# Patient Record
Sex: Male | Born: 1965 | Race: Black or African American | Hispanic: No | Smoking: Current every day smoker
Health system: Southern US, Community
[De-identification: ages and names within clinical notes are randomized; demographics above are authoritative.]

## PROBLEM LIST (undated history)

## (undated) HISTORY — PX: ELBOW SURGERY: SHX618

---

## 1998-07-01 ENCOUNTER — Emergency Department (HOSPITAL_COMMUNITY): Admission: EM | Admit: 1998-07-01 | Discharge: 1998-07-01 | Payer: Self-pay | Admitting: Endocrinology

## 1999-09-12 ENCOUNTER — Encounter: Payer: Self-pay | Admitting: Emergency Medicine

## 1999-09-12 ENCOUNTER — Inpatient Hospital Stay (HOSPITAL_COMMUNITY): Admission: EM | Admit: 1999-09-12 | Discharge: 1999-09-14 | Payer: Self-pay | Admitting: Emergency Medicine

## 1999-09-13 ENCOUNTER — Encounter: Payer: Self-pay | Admitting: Orthopaedic Surgery

## 2008-03-06 ENCOUNTER — Ambulatory Visit (HOSPITAL_BASED_OUTPATIENT_CLINIC_OR_DEPARTMENT_OTHER): Admission: RE | Admit: 2008-03-06 | Discharge: 2008-03-06 | Payer: Self-pay | Admitting: Urology

## 2009-07-06 ENCOUNTER — Emergency Department (HOSPITAL_COMMUNITY): Admission: EM | Admit: 2009-07-06 | Discharge: 2009-07-06 | Payer: Self-pay | Admitting: Emergency Medicine

## 2011-03-16 ENCOUNTER — Inpatient Hospital Stay (INDEPENDENT_AMBULATORY_CARE_PROVIDER_SITE_OTHER)
Admission: RE | Admit: 2011-03-16 | Discharge: 2011-03-16 | Disposition: A | Discharge status: Home / Self Care | Payer: 59 | Source: Ambulatory Visit | Attending: Family Medicine | Admitting: Family Medicine

## 2011-03-16 DIAGNOSIS — R51 Headache: Secondary | ICD-10-CM

## 2011-03-19 ENCOUNTER — Other Ambulatory Visit: Payer: Self-pay | Admitting: Internal Medicine

## 2011-03-19 DIAGNOSIS — R51 Headache: Secondary | ICD-10-CM

## 2011-03-21 ENCOUNTER — Other Ambulatory Visit: Payer: 59

## 2011-03-26 ENCOUNTER — Ambulatory Visit
Admission: RE | Admit: 2011-03-26 | Discharge: 2011-03-26 | Disposition: A | Discharge status: Home / Self Care | Payer: 59 | Source: Ambulatory Visit | Attending: Internal Medicine | Admitting: Internal Medicine

## 2011-03-26 DIAGNOSIS — R51 Headache: Secondary | ICD-10-CM

## 2011-04-29 NOTE — Consult Note (Signed)
NAMEMUSA, REWERTS NO.:  [id number]   MEDICAL RECORD NO.:  [id number]          PATIENT TYPE:  EMS   LOCATION:  [facility]                         FACILITY:  [facility]   PHYSICIAN:  Loreta Ave, MD DATE OF BIRTH:  1966-09-26   DATE OF CONSULTATION:  07/06/2009  DATE OF DISCHARGE:  07/06/2009                                 CONSULTATION   REFERRING Docia Klar:  Emergency room.   CHIEF COMPLAINT:  Left thumb crush injury.   HISTORY OF PRESENT ILLNESS:  Douglas Holloway is a 45 year old right hand  dominant male who was working early this morning when a piece of heavy  equipment (several tons) fell from an I-beam and crushed his left thumb.  He sustained no injuries to his other fingers.  He developed laceration  along the radial and ulnar borders of the thumb with associated nail bed  injury.  He has intact sensation to light touch along the volar aspect  of his thumb.   MEDICATION ALLERGIES:  WELLBUTRIN causes a rash.   MEDICATIONS:  None.   PAST MEDICAL HISTORY:  None.   PAST SURGICAL HISTORY:  None.   SOCIAL HISTORY:  Smokes 1 pack per day.  Drinks occasionally.  Denies IV  drug use.   FAMILY HISTORY:  Noncontributory.   REVIEW OF SYSTEMS:  Noncontributory.   PHYSICAL EXAMINATION:  EXTREMITIES:  Examination of the left hand  reveals normoactive and passive range of motion of the fingers, hand,  and wrist.  Flexion and extension are intact to the distal phalanx of  the left thumb.  He has a rupture of the skin at the glabrous and  nonglabrous junction along the ulnar aspect of the distal and proximal  phalanges.  He also has ruptured the skin over the pulp of the thumb pad  involving a laceration that is 2 cm long.  There is also laceration of  the nail bed with some loss of the nail bed on the radial portion.   X-ray examination revealed a comminuted tuft fracture.  However, the  main body of the distal phalanx is intact with smaller fragments  distally.   There is no fracture of the proximal phalanx.   ASSESSMENT AND PLAN:  Douglas Holloway is a 45 year old right hand-dominant  male with a left exploded thumb.   I discussed the severe nature of this injury with Douglas Holloway and told him  that I could not guarantee that the volar skin flap or his thumb would  survive in their entirety.  I offered repair of the injured structures  including the skin and nail bed and open distal phalanx fracture.  He  understands the risks of repair to be infection, bleeding, stiffness,  scarring, eventual loss of his thumb, potentially the need for  amputation, and the need for additional reconstructive surgeries.  He  desires to proceed.   After verbal consent, a digital block of 2% lidocaine plain was injected  at about the base of the left thumb.  A digital tourniquet was applied  at the base.  The wound was irrigated copiously with normal saline, and  bony fragments were removed from the wound.  There were tattered edges  along the radial side of the nailbed and these were debrided.  His nail  was removed.  Next, his skin lacerations were repaired with interrupted  4-0 chromic sutures.  His nail bed was repaired with a 6-0 running  chromic suture.  His nail was trimmed and placed back over the nailbed  to stent the petechial fold.  This was sutured in place with 4-0 chromic  sutures.  Xeroform, dry sterile dressings, and a thumb spica were  applied.  The digital tourniquet was previously removed and good  capillary refill was noted to all aspects of the thumb tip.   Douglas Holloway was given a prescription for Dilaudid 2 mg 1-2 p.o. q.2-3 h  p.r.n. for pain, dispensed 80 and Augmentin 875 mg p.o. b.i.d. for 5  days.  His tetanus is up-to-date.  I will see him for a wound check n 6  days and will continue with his splint.  He is not to work while he is  taking pain medicine.  Once he is no longer taking pain medicine, he may  return to work with no use of his  left hand.      Loreta Ave, MD  Electronically Signed     CF/MEDQ  D:  07/06/2009  T:  07/07/2009  Job:  [id number]

## 2011-04-29 NOTE — Op Note (Signed)
Douglas Holloway, Douglas Holloway                ACCOUNT NO.:  [id number]   MEDICAL RECORD NO.:  [id number]          PATIENT TYPE:  AMB   LOCATION:  [facility]                         FACILITY:  [facility]   PHYSICIAN:  Valetta Fuller, M.D.  DATE OF BIRTH:  Nov 26, 1966   DATE OF PROCEDURE:  03/06/2008  DATE OF DISCHARGE:                               OPERATIVE REPORT   PREOPERATIVE DIAGNOSIS:  Penile phimosis with recurrent balanitis.   POSTOPERATIVE DIAGNOSIS:  Penile phimosis with recurrent balanitis.   PROCEDURE PERFORMED:  Circumcision.   INDICATIONS:  [name] is a 45 year old male.  He has come in on several  occasions in our office complaining of some mild phimosis with recurrent  balanitis and penile irritation with intercourse.  We have talked to him  in the past about the possibility of circumcision.  He wished to proceed  with that.  He appeared to understand the advantages, disadvantages,  potential complications of circumcision and full informed consent was  obtained.  He presents now for the procedure.   TECHNIQUE AND FINDINGS:  The patient was brought to the operating room  where he had successful induction of general anesthesia.  He was placed  in the supine position, prepped and draped in the usual manner.  A  penile block with plain Marcaine was utilized to reduce anesthetic  requirements and to help with postop analgesia.  A circumferential  incision was made behind the coronal sulcus.  The foreskin was then  retracted.  There was no evidence of active infection or balanitis at  this time.  A separate circumferential incision was made the mucosal  collar and the sleeve of redundant skin was removed.  Skin edges were  then reapproximated with 4-0 Vicryl suture in an interrupted manner.  A  loosely applied dressing was then placed around the incision including  some petroleum gauze with bacitracin gauze and loosely applied Coban.  The patient appeared to tolerate the procedure well.   There were no  obvious complications or difficulties.  He was brought to the recovery  room in stable condition.           ______________________________  Valetta Fuller, M.D.  Electronically Signed     DSG/MEDQ  D:  03/06/2008  T:  03/06/2008  Job:  [id number]

## 2011-09-08 LAB — POCT HEMOGLOBIN-HEMACUE: Hemoglobin: 12.9 — ABNORMAL LOW

## 2014-12-03 ENCOUNTER — Encounter (HOSPITAL_COMMUNITY): Payer: Self-pay

## 2014-12-03 ENCOUNTER — Emergency Department (HOSPITAL_COMMUNITY)
Admission: EM | Admit: 2014-12-03 | Discharge: 2014-12-03 | Disposition: A | Discharge status: Home / Self Care | Payer: BC Managed Care – PPO | Attending: Emergency Medicine | Admitting: Emergency Medicine

## 2014-12-03 DIAGNOSIS — Y998 Other external cause status: Secondary | ICD-10-CM | POA: Insufficient documentation

## 2014-12-03 DIAGNOSIS — Y9389 Activity, other specified: Secondary | ICD-10-CM | POA: Diagnosis not present

## 2014-12-03 DIAGNOSIS — S0993XA Unspecified injury of face, initial encounter: Secondary | ICD-10-CM | POA: Diagnosis present

## 2014-12-03 DIAGNOSIS — S01511A Laceration without foreign body of lip, initial encounter: Secondary | ICD-10-CM | POA: Diagnosis not present

## 2014-12-03 DIAGNOSIS — Z720 Tobacco use: Secondary | ICD-10-CM | POA: Insufficient documentation

## 2014-12-03 DIAGNOSIS — Y92410 Unspecified street and highway as the place of occurrence of the external cause: Secondary | ICD-10-CM | POA: Diagnosis not present

## 2014-12-03 MED ORDER — IBUPROFEN 800 MG PO TABS
800.0000 mg | ORAL_TABLET | Freq: Once | ORAL | Status: AC
  Administered 2014-12-03: 800 mg via ORAL
  Filled 2014-12-03: qty 1

## 2014-12-03 MED ORDER — IBUPROFEN 600 MG PO TABS: 600.0000 mg | ORAL_TABLET | Freq: Four times a day (QID) | ORAL | Status: DC | PRN

## 2014-12-03 MED ORDER — CYCLOBENZAPRINE HCL 10 MG PO TABS: 10.0000 mg | ORAL_TABLET | Freq: Two times a day (BID) | ORAL | Status: AC | PRN
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
## 2014-12-03 NOTE — ED Notes (Signed)
Pt rinsed mouth with 1/2 strength peroxide.

## 2014-12-07 ENCOUNTER — Encounter (HOSPITAL_COMMUNITY): Payer: Self-pay | Admitting: Emergency Medicine

## 2014-12-07 ENCOUNTER — Emergency Department (HOSPITAL_COMMUNITY): Payer: BC Managed Care – PPO

## 2014-12-07 ENCOUNTER — Emergency Department (HOSPITAL_COMMUNITY)
Admission: EM | Admit: 2014-12-07 | Discharge: 2014-12-08 | Disposition: A | Discharge status: Home / Self Care | Payer: BC Managed Care – PPO | Attending: Emergency Medicine | Admitting: Emergency Medicine

## 2014-12-07 DIAGNOSIS — S199XXD Unspecified injury of neck, subsequent encounter: Secondary | ICD-10-CM | POA: Insufficient documentation

## 2014-12-07 DIAGNOSIS — S0993XD Unspecified injury of face, subsequent encounter: Secondary | ICD-10-CM | POA: Diagnosis present

## 2014-12-07 DIAGNOSIS — R51 Headache: Secondary | ICD-10-CM | POA: Insufficient documentation

## 2014-12-07 DIAGNOSIS — R220 Localized swelling, mass and lump, head: Secondary | ICD-10-CM

## 2014-12-07 DIAGNOSIS — Z720 Tobacco use: Secondary | ICD-10-CM | POA: Diagnosis not present

## 2014-12-07 DIAGNOSIS — S0083XD Contusion of other part of head, subsequent encounter: Secondary | ICD-10-CM | POA: Insufficient documentation

## 2014-12-07 DIAGNOSIS — G4452 New daily persistent headache (NDPH): Secondary | ICD-10-CM

## 2014-12-07 MED ORDER — HYDROCODONE-ACETAMINOPHEN 5-325 MG PO TABS
2.0000 | ORAL_TABLET | Freq: Once | ORAL | Status: AC
  Administered 2014-12-07: 2 via ORAL
  Filled 2014-12-07: qty 2

## 2014-12-07 MED ORDER — HYDROCODONE-ACETAMINOPHEN 5-325 MG PO TABS: 1.0000 | ORAL_TABLET | Freq: Four times a day (QID) | ORAL | Status: DC | PRN
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
## 2014-12-07 NOTE — Discharge Instructions (Signed)
1. Medications: vicodin, usual home medications 2. Treatment: rest, drink plenty of fluids, ibuprofen for continued headaches 3. Follow Up: Please followup with your primary doctor in 4 days for discussion of your diagnoses and further evaluation after today's visit; if you do not have a primary care doctor use the resource guide provided to find one; Please return to the ER for seizures, worsening confusion, other concerning symptoms   Post-Concussion Syndrome Post-concussion syndrome describes the symptoms that can occur after a head injury. These symptoms can last from weeks to months. CAUSES  It is not clear why some head injuries cause post-concussion syndrome. It can occur whether your head injury was mild or severe and whether you were wearing head protection or not.  SIGNS AND SYMPTOMS  Memory difficulties.  Dizziness.  Headaches.  Double vision or blurry vision.  Sensitivity to light.  Hearing difficulties.  Depression.  Tiredness.  Weakness.  Difficulty with concentration.  Difficulty sleeping or staying asleep.  Vomiting.  Poor balance or instability on your feet.  Slow reaction time.  Difficulty learning and remembering things you have heard. DIAGNOSIS  There is no test to determine whether you have post-concussion syndrome. Your health care provider may order an imaging scan of your brain, such as a CT scan, to check for other problems that may be causing your symptoms (such as severe injury inside your skull). TREATMENT  Usually, these problems disappear over time without medical care. Your health care provider may prescribe medicine to help ease your symptoms. It is important to follow up with a neurologist to evaluate your recovery and address any lingering symptoms or issues. HOME CARE INSTRUCTIONS   Only take over-the-counter or prescription medicines for pain, discomfort, or fever as directed by your health care provider. Do not take aspirin. Aspirin  can slow blood clotting.  Sleep with your head slightly elevated to help with headaches.  Avoid any situation where there is potential for another head injury (football, hockey, soccer, basketball, martial arts, downhill snow sports, and horseback riding). Your condition will get worse every time you experience a concussion. You should avoid these activities until you are evaluated by the appropriate follow-up health care providers.  Keep all follow-up appointments as directed by your health care provider. SEEK IMMEDIATE MEDICAL CARE IF:  You develop confusion or unusual drowsiness.  You cannot wake the injured person.  You develop nausea or persistent, forceful vomiting.  You feel like you are moving when you are not (vertigo).  You notice the injured person's eyes moving rapidly back and forth. This may be a sign of vertigo.  You have convulsions or faint.  You have severe, persistent headaches that are not relieved by medicine.  You cannot use your arms or legs normally.  Your pupils change size.  You have clear or bloody discharge from the nose or ears.  Your problems are getting worse, not better. MAKE SURE YOU:  Understand these instructions.  Will watch your condition.  Will get help right away if you are not doing well or get worse. Document Released: 05/23/2002 Document Revised: 09/21/2013 Document Reviewed: 03/08/2014 [facility] Patient Information 2015 [place], [place]. This information is not intended to replace advice given to you by your health care provider. Make sure you discuss any questions you have with your health care provider.
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
## 2015-01-31 ENCOUNTER — Ambulatory Visit
Admission: RE | Admit: 2015-01-31 | Discharge: 2015-01-31 | Disposition: A | Discharge status: Home / Self Care | Payer: BLUE CROSS/BLUE SHIELD | Source: Ambulatory Visit | Attending: Internal Medicine | Admitting: Internal Medicine

## 2015-01-31 ENCOUNTER — Other Ambulatory Visit: Payer: Self-pay | Admitting: Internal Medicine

## 2015-01-31 DIAGNOSIS — F17201 Nicotine dependence, unspecified, in remission: Secondary | ICD-10-CM

## 2015-07-19 DIAGNOSIS — Y990 Civilian activity done for income or pay: Secondary | ICD-10-CM | POA: Insufficient documentation

## 2015-07-19 DIAGNOSIS — W1839XA Other fall on same level, initial encounter: Secondary | ICD-10-CM | POA: Insufficient documentation

## 2015-07-19 DIAGNOSIS — Y9289 Other specified places as the place of occurrence of the external cause: Secondary | ICD-10-CM | POA: Diagnosis not present

## 2015-07-19 DIAGNOSIS — S8991XA Unspecified injury of right lower leg, initial encounter: Secondary | ICD-10-CM | POA: Insufficient documentation

## 2015-07-19 DIAGNOSIS — Y9389 Activity, other specified: Secondary | ICD-10-CM | POA: Insufficient documentation

## 2015-07-19 DIAGNOSIS — Z720 Tobacco use: Secondary | ICD-10-CM | POA: Insufficient documentation

## 2015-07-20 ENCOUNTER — Encounter (HOSPITAL_COMMUNITY): Payer: Self-pay | Admitting: Emergency Medicine

## 2015-07-20 ENCOUNTER — Emergency Department (HOSPITAL_COMMUNITY): Payer: Worker's Compensation

## 2015-07-20 ENCOUNTER — Emergency Department (HOSPITAL_COMMUNITY)
Admission: EM | Admit: 2015-07-20 | Discharge: 2015-07-20 | Disposition: A | Discharge status: Home / Self Care | Payer: Worker's Compensation | Attending: Emergency Medicine | Admitting: Emergency Medicine

## 2015-07-20 DIAGNOSIS — M25561 Pain in right knee: Secondary | ICD-10-CM

## 2015-07-20 MED ORDER — IBUPROFEN 800 MG PO TABS: 800.0000 mg | ORAL_TABLET | Freq: Three times a day (TID) | ORAL | Status: AC

## 2015-07-20 MED ORDER — HYDROCODONE-ACETAMINOPHEN 5-325 MG PO TABS: 1.0000 | ORAL_TABLET | Freq: Four times a day (QID) | ORAL | Status: AC | PRN

## 2015-07-20 NOTE — ED Notes (Signed)
Pt. fell at work this evening , no LOC / ambulatory , reports pain at right knee worse with movement .

## 2015-07-20 NOTE — ED Notes (Signed)
Pt states unable to void at this time. 

## 2015-07-20 NOTE — ED Notes (Signed)
Pt urinated for work comp drug screen without witness present, so specimen must be recollected.

## 2015-07-20 NOTE — ED Provider Notes (Signed)
CSN: [id number]     Arrival date & time 07/19/15  2357 History   First MD Initiated Contact with Patient 07/20/15 0023     Chief Complaint  Patient presents with  . Knee Pain     (Consider location/radiation/quality/duration/timing/severity/associated sxs/prior Treatment) HPI   Patient is a 49 year old male reports the ER after sustaining a work injury where he was carrying heavy objects and had a planting of his right foot with a twisting motion and fell, with acute onset right knee pain and swelling.  He is able to initially bear some weight, but he has had increasing swelling and pain which is made him limp.  The pain is located on the medial side of his knee with some mild swelling but no erythema and he does not have any abrasions or deformities.    History reviewed. No pertinent past medical history. Past Surgical History  Procedure Laterality Date  . Elbow surgery     No family history on file. History  Substance Use Topics  . Smoking status: Current Every Day Smoker  . Smokeless tobacco: Not on file  . Alcohol Use: Yes     Comment: occ     Review of Systems  All other systems reviewed and are negative.  Allergies  Wellbutrin  Home Medications   Prior to Admission medications   Medication Sig Start Date End Date Taking? Authorizing Provider  cyclobenzaprine (FLEXERIL) 10 MG tablet Take 1 tablet (10 mg total) by mouth 2 (two) times daily as needed for muscle spasms. 12/03/14   Arby Barrette, MD  HYDROcodone-acetaminophen (NORCO/VICODIN) 5-325 MG per tablet Take 1-2 tablets by mouth every 6 (six) hours as needed for severe pain. 07/20/15   Danelle Berry, PA-C  ibuprofen (ADVIL,MOTRIN) 800 MG tablet Take 1 tablet (800 mg total) by mouth 3 (three) times daily. 07/20/15   Danelle Berry, PA-C   BP 95/75 mmHg  Pulse 76  Temp(Src) 98.2 F (36.8 C) (Oral)  Resp 22  Ht  (1.651 m)  Wt 112 lb (50.803 kg)  BMI 18.64 kg/m2  SpO2 100% Physical Exam  Constitutional: He is  oriented to person, place, and time. He appears well-developed and well-nourished. No distress.  HENT:  Head: Normocephalic and atraumatic.  Right Ear: External ear normal.  Left Ear: External ear normal.  Nose: Nose normal.  Eyes: Conjunctivae and EOM are normal. Pupils are equal, round, and reactive to light. Right eye exhibits no discharge. Left eye exhibits no discharge. No scleral icterus.  Neck: Normal range of motion. Neck supple. No JVD present. No tracheal deviation present.  Cardiovascular: Normal rate and regular rhythm.   Pulmonary/Chest: Effort normal and breath sounds normal. No stridor. No respiratory distress.  Musculoskeletal: He exhibits edema and tenderness.       Right knee: He exhibits swelling, effusion, bony tenderness and abnormal meniscus. He exhibits no ecchymosis, no laceration, no erythema, normal alignment, no LCL laxity and normal patellar mobility. Tenderness found. Medial joint line and MCL tenderness noted. No lateral joint line, no LCL and no patellar tendon tenderness noted.  Right knee tenderness to MCL, with palpable ballotting No tenderness to lateral knee, popliteal fossa, patella, supra-or infrapatellar tendon.  Negative anterior drawer test.  Positive McMurray's, pain with varus and valgus stress.   Knee flexion to 90, extension limited by pain  Lymphadenopathy:    He has no cervical adenopathy.  Neurological: He is alert and oriented to person, place, and time. He is not disoriented. No sensory deficit. He  exhibits normal muscle tone. Coordination normal.  Antalgic gait  Skin: Skin is warm and dry. No rash noted. He is not diaphoretic. No erythema. No pallor.  Psychiatric: He has a normal mood and affect. His behavior is normal. Judgment and thought content normal.  Nursing note and vitals reviewed.   ED Course  Procedures (including critical care time) Labs Review Labs Reviewed - No data to display  Imaging Review Dg Knee Complete 4 Views  Right  07/20/2015   CLINICAL DATA:  Status post fall into hole, with right medial knee pain. Initial encounter.  EXAM: RIGHT KNEE - COMPLETE 4+ VIEW  COMPARISON:  None.  FINDINGS: There is no evidence of fracture or dislocation. The joint spaces are preserved. No significant degenerative change is seen; the patellofemoral joint is grossly unremarkable in appearance. A small enthesophyte is seen at the upper pole of the patella.  A small knee joint effusion is suggested. The visualized soft tissues are otherwise unremarkable in appearance.  IMPRESSION: 1. No evidence of fracture or dislocation. 2. Small knee joint effusion suggested.   Electronically Signed   By: Roanna Raider M.D.   On: 07/20/2015 01:19    EKG Interpretation None      MDM   Final diagnoses:  Knee pain, acute, right    Patient with right knee pain - tenderness the medial aspect of the right knee with effusion Patient is able to walk but states it is painful, negative anterior drawer, knee does not appear unstable. Clinically I suspect a medial meniscus and MCL injury. X-rays obtained which reveal no fracture and small effusion - pt was place in knee immobilizer and fitted with crutches, with education from ED tech. I reviewed the findings with the patient, I have discussed tx with the patient - and need for rest, ice, compression and elevation as well as NSAIDs with brief pain pill prescription and follow-up with orthopedic surgeon.  PT vitals were reviewed, he was discharged home in satisfactory condition.       Danelle Berry, PA-C 07/20/15 0144  Eber Hong, MD 07/20/15 [phone]

## 2015-07-20 NOTE — Discharge Instructions (Signed)

## 2015-07-31 ENCOUNTER — Other Ambulatory Visit: Payer: Self-pay | Admitting: Orthopaedic Surgery

## 2015-07-31 DIAGNOSIS — M25561 Pain in right knee: Secondary | ICD-10-CM

## 2015-08-07 ENCOUNTER — Ambulatory Visit
Admission: RE | Admit: 2015-08-07 | Discharge: 2015-08-07 | Disposition: A | Discharge status: Home / Self Care | Payer: Worker's Compensation | Source: Ambulatory Visit | Attending: Orthopaedic Surgery | Admitting: Orthopaedic Surgery

## 2015-08-07 DIAGNOSIS — M25561 Pain in right knee: Secondary | ICD-10-CM

## 2015-09-02 ENCOUNTER — Emergency Department (HOSPITAL_COMMUNITY)
Admission: EM | Admit: 2015-09-02 | Discharge: 2015-09-02 | Disposition: A | Discharge status: Home / Self Care | Payer: BLUE CROSS/BLUE SHIELD | Attending: Emergency Medicine | Admitting: Emergency Medicine

## 2015-09-02 ENCOUNTER — Encounter (HOSPITAL_COMMUNITY): Payer: Self-pay | Admitting: *Deleted

## 2015-09-02 DIAGNOSIS — R51 Headache: Secondary | ICD-10-CM | POA: Insufficient documentation

## 2015-09-02 DIAGNOSIS — R519 Headache, unspecified: Secondary | ICD-10-CM

## 2015-09-02 DIAGNOSIS — Z720 Tobacco use: Secondary | ICD-10-CM | POA: Diagnosis not present

## 2015-09-02 MED ORDER — METOCLOPRAMIDE HCL 10 MG PO TABS: 10.0000 mg | ORAL_TABLET | Freq: Four times a day (QID) | ORAL | Status: AC | PRN

## 2015-09-02 MED ORDER — METOCLOPRAMIDE HCL 5 MG/ML IJ SOLN: 10.0000 mg | Freq: Once | INTRAMUSCULAR | Status: DC

## 2015-09-02 MED ORDER — KETOROLAC TROMETHAMINE 15 MG/ML IJ SOLN
15.0000 mg | Freq: Once | INTRAMUSCULAR | Status: AC
  Administered 2015-09-02: 15 mg via INTRAVENOUS
  Filled 2015-09-02: qty 1

## 2015-09-02 MED ORDER — SODIUM CHLORIDE 0.9 % IV BOLUS (SEPSIS)
500.0000 mL | Freq: Once | INTRAVENOUS | Status: AC
  Administered 2015-09-02: 500 mL via INTRAVENOUS

## 2015-09-02 MED ORDER — PROPARACAINE HCL 0.5 % OP SOLN
1.0000 [drp] | Freq: Once | OPHTHALMIC | Status: AC
  Administered 2015-09-02: 1 [drp] via OPHTHALMIC
  Filled 2015-09-02: qty 15

## 2015-09-02 MED ORDER — DIPHENHYDRAMINE HCL 50 MG/ML IJ SOLN
25.0000 mg | Freq: Once | INTRAMUSCULAR | Status: AC
  Administered 2015-09-02: 25 mg via INTRAVENOUS
  Filled 2015-09-02: qty 1

## 2015-09-02 MED ORDER — DIPHENHYDRAMINE HCL 25 MG PO CAPS: 25.0000 mg | ORAL_CAPSULE | Freq: Once | ORAL | Status: DC

## 2015-09-02 MED ORDER — KETOROLAC TROMETHAMINE 60 MG/2ML IM SOLN: 60.0000 mg | Freq: Once | INTRAMUSCULAR | Status: DC

## 2015-09-02 MED ORDER — METOCLOPRAMIDE HCL 5 MG/ML IJ SOLN
10.0000 mg | Freq: Once | INTRAMUSCULAR | Status: AC
  Administered 2015-09-02: 10 mg via INTRAVENOUS
  Filled 2015-09-02: qty 2

## 2015-09-02 NOTE — ED Notes (Signed)
Pt c/o worsening headaches over he past month. Pt states that he has had headaches related to caffeine consumption. States that he has been drinking much less coffee but headache is still persisting. Pt states that he has tried ibuprofen for pain with minimal relief.

## 2015-09-02 NOTE — Discharge Instructions (Signed)
If you were given medicines take as directed.  If you are on coumadin or contraceptives realize their levels and effectiveness is altered by many different medicines.  If you have any reaction (rash, tongues swelling, other) to the medicines stop taking and see a physician.    If your blood pressure was elevated in the ER make sure you follow up for management with a primary doctor or return for chest pain, shortness of breath or stroke symptoms.  Please follow up as directed and return to the ER or see a physician for new or worsening symptoms.  Thank you. Filed Vitals:   09/02/15 2048  BP: 113/84  Pulse: 83  Temp: 98.1 F (36.7 C)  TempSrc: Oral  Resp: 16  Height:  (1.651 m)  Weight: 115 lb (52.164 kg)  SpO2: 98%

## 2015-09-02 NOTE — ED Notes (Signed)
Patient is alert and orientedx4.  Patient was explained discharge instructions and they understood them with no questions.   

## 2015-09-02 NOTE — ED Provider Notes (Signed)
CSN: [id number]     Arrival date & time 09/02/15  2041 History   First MD Initiated Contact with Patient 09/02/15 2054     Chief Complaint  Patient presents with  . Headache     (Consider location/radiation/quality/duration/timing/severity/associated sxs/prior Treatment) HPI Comments: 49 year old male with smoking history presents with intermittent left-sided headaches for the past 2 weeks gradually worsening. No focal tenderness to the temple, node jaw pain or shoulder pain. Patient has had headaches intermittent for years however they've been worsening recently. Patient does were glasses but has not had vision checked in over a year. Patient has had headaches related to caffeine in the past and he still uses 1-2 cups a day has not used in 24 hours. No injuries. No neurologic symptoms.  Patient is a 49 y.o. male presenting with headaches. The history is provided by the patient.  Headache Associated symptoms: no abdominal pain, no back pain, no congestion, no fever, no neck pain, no neck stiffness, no photophobia, no vomiting and no weakness     History reviewed. No pertinent past medical history. Past Surgical History  Procedure Laterality Date  . Elbow surgery     No family history on file. Social History  Substance Use Topics  . Smoking status: Current Every Day Smoker  . Smokeless tobacco: None  . Alcohol Use: Yes     Comment: occ     Review of Systems  Constitutional: Negative for fever and chills.  HENT: Negative for congestion.   Eyes: Negative for photophobia and visual disturbance.  Respiratory: Negative for shortness of breath.   Cardiovascular: Negative for chest pain.  Gastrointestinal: Negative for vomiting and abdominal pain.  Genitourinary: Negative for dysuria and flank pain.  Musculoskeletal: Negative for back pain, neck pain and neck stiffness.  Skin: Negative for rash.  Neurological: Positive for headaches. Negative for weakness and light-headedness.       Allergies  Wellbutrin  Home Medications   Prior to Admission medications   Medication Sig Start Date End Date Taking? Authorizing Provider  cyclobenzaprine (FLEXERIL) 10 MG tablet Take 1 tablet (10 mg total) by mouth 2 (two) times daily as needed for muscle spasms. 12/03/14   Arby Barrette, MD  HYDROcodone-acetaminophen (NORCO/VICODIN) 5-325 MG per tablet Take 1-2 tablets by mouth every 6 (six) hours as needed for severe pain. 07/20/15   Danelle Berry, PA-C  ibuprofen (ADVIL,MOTRIN) 800 MG tablet Take 1 tablet (800 mg total) by mouth 3 (three) times daily. 07/20/15   Danelle Berry, PA-C  metoCLOPramide (REGLAN) 10 MG tablet Take 1 tablet (10 mg total) by mouth every 6 (six) hours as needed for nausea (nausea/headache, take with benadryl). 09/02/15   Blane Ohara, MD   BP 113/84 mmHg  Pulse 83  Temp(Src) 98.1 F (36.7 C) (Oral)  Resp 16  Ht  (1.651 m)  Wt 115 lb (52.164 kg)  BMI 19.14 kg/m2  SpO2 98% Physical Exam  Constitutional: He is oriented to person, place, and time. He appears well-developed and well-nourished.  HENT:  Head: Normocephalic and atraumatic.  Eyes: Conjunctivae are normal. Right eye exhibits no discharge. Left eye exhibits no discharge.  Neck: Normal range of motion. Neck supple. No tracheal deviation present.  Cardiovascular: Normal rate.   Pulmonary/Chest: Effort normal.  Abdominal: Soft. He exhibits no distension. There is no tenderness. There is no guarding.  Musculoskeletal: He exhibits no edema.  Neurological: He is alert and oriented to person, place, and time.  5+ strength in UE and LE with f/e  at major joints. Sensation to palpation intact in UE and LE. CNs 2-12 grossly intact.  EOMFI.  PERRL.   Finger nose and coordination intact bilateral.   Visual fields intact to finger testing. No nystagmus Neck supple. No temple tenderness.    Skin: Skin is warm. No rash noted.  Psychiatric: He has a normal mood and affect.  Nursing note and vitals  reviewed.   ED Course  Procedures (including critical care time) Labs Review Labs Reviewed - No data to display  Imaging Review No results found. I have personally reviewed and evaluated these images and lab results as part of my medical decision-making.   EKG Interpretation None      MDM   Final diagnoses:  Left-sided headache   Patient presents with recurrent worsening headache overall similar to previous.  Discussed possible differential relating to caffeine use, cluster headache, related to patient needing change in glasses, other. No focal temple tenderness normal neuro exam. Plan to check eye pressures pain meds and outpatient follow.  Nl IOP Results and differential diagnosis were discussed with the patient/parent/guardian. Xrays were independently reviewed by myself.  Close follow up outpatient was discussed, comfortable with the plan.   Medications  ketorolac (TORADOL) 15 MG/ML injection 15 mg (15 mg Intravenous Given 09/02/15 2203)  metoCLOPramide (REGLAN) injection 10 mg (10 mg Intravenous Given 09/02/15 2204)  diphenhydrAMINE (BENADRYL) injection 25 mg (25 mg Intravenous Given 09/02/15 2203)  sodium chloride 0.9 % bolus 500 mL (500 mLs Intravenous New Bag/Given 09/02/15 2203)  proparacaine (ALCAINE) 0.5 % ophthalmic solution 1 drop (1 drop Left Eye Given 09/02/15 2210)    Filed Vitals:   09/02/15 2048  BP: 113/84  Pulse: 83  Temp: 98.1 F (36.7 C)  TempSrc: Oral  Resp: 16  Height:  (1.651 m)  Weight: 115 lb (52.164 kg)  SpO2: 98%    Final diagnoses:  Left-sided headache       Blane Ohara, MD 09/02/15 2309

## 2016-10-06 IMAGING — MR MR KNEE*R* W/O CM
4 of 6 series · 19 of 40 positions shown · non-contrast
Comparison: Radiographs 07/20/2015.

CLINICAL DATA: Medial knee pain and swelling after falling and
twisting knee at work 3 weeks ago. No previous relevant surgery.
Initial encounter.

EXAM:
MRI OF THE RIGHT KNEE WITHOUT CONTRAST
TECHNIQUE: Multiplanar, multisequence MR imaging of the knee was performed. No
intravenous contrast was administered.

[Series 3: PD fat-sat · axial · 4.0mm · 0.27mm/px · z∈[-46,+84]mm · 8 of 27 slices shown (1 of 4)]
[im 1/27]
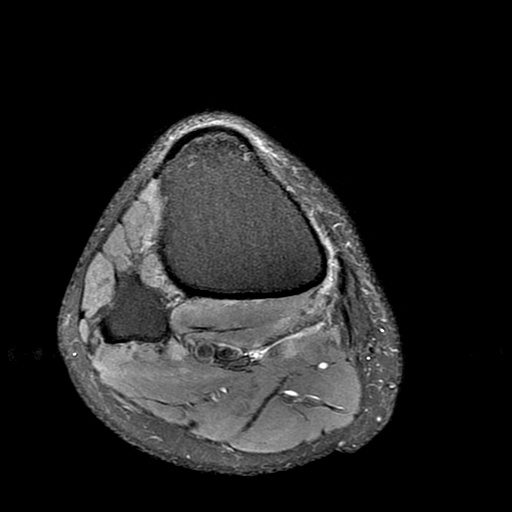
[im 3/27]
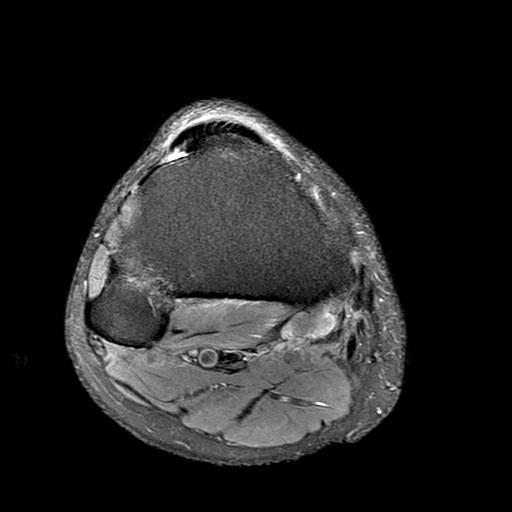
[im 9/27]
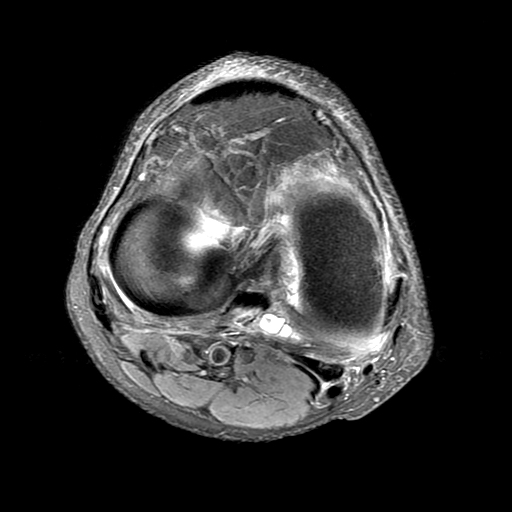
[im 12/27]
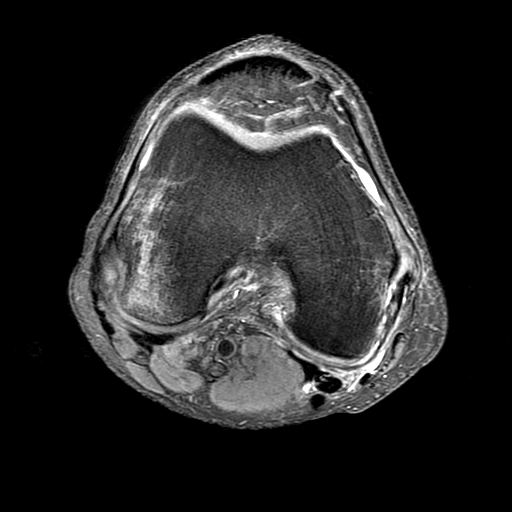
[im 15/27]
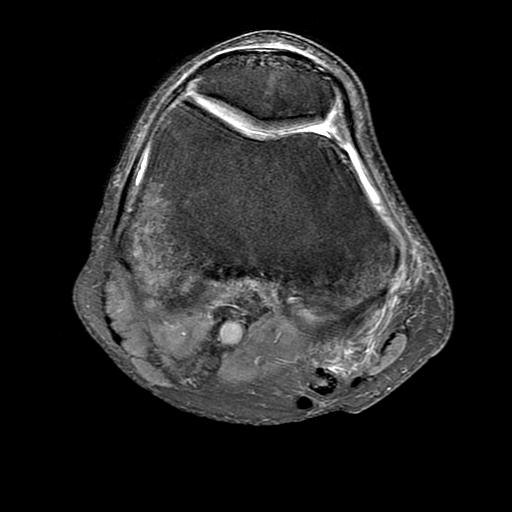
[im 18/27]
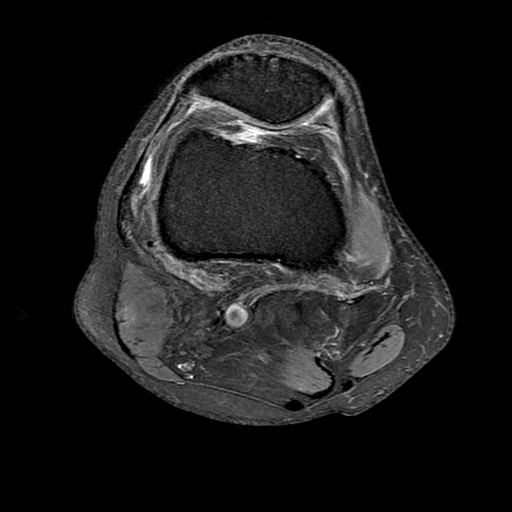
[im 24/27]
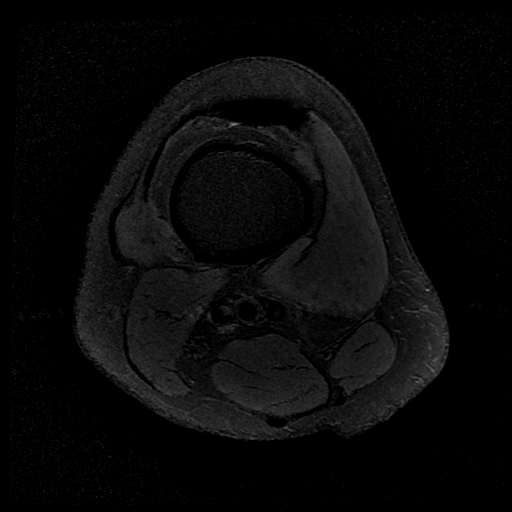
[im 27/27]
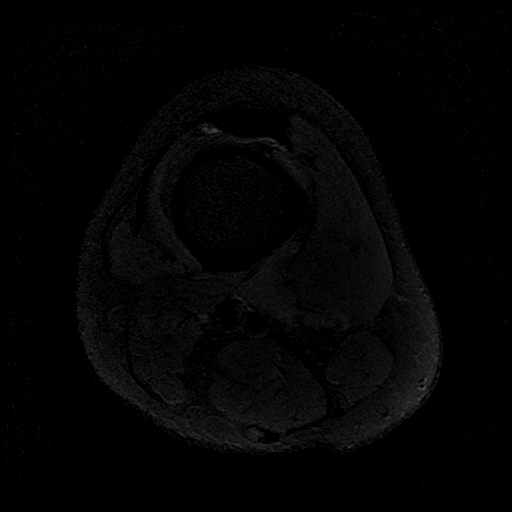

[Series 4: PD fat-sat · sagittal · 3.0mm · 0.25mm/px · 5 of 24 slices shown (2 of 4)]
[im 1/24]
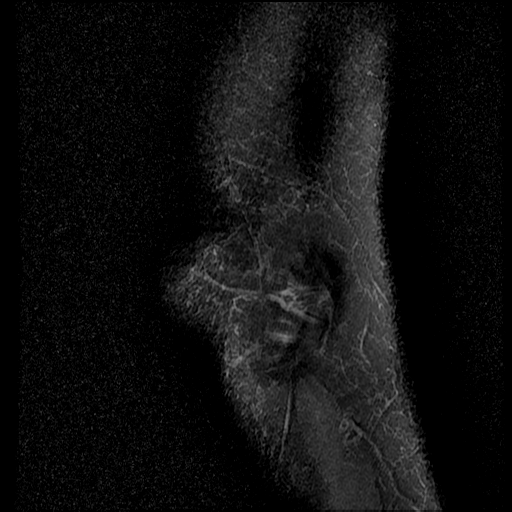
[im 4/24]
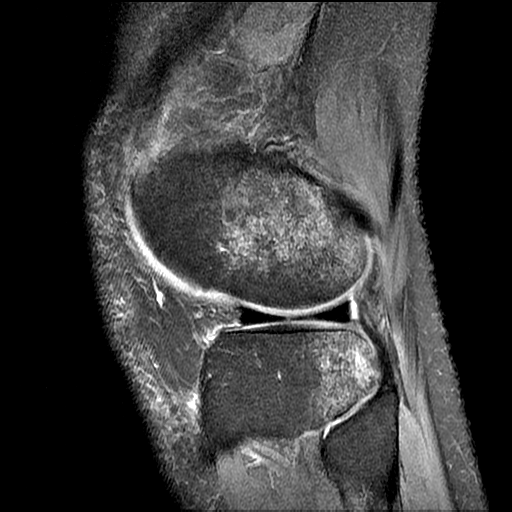
[im 7/24]
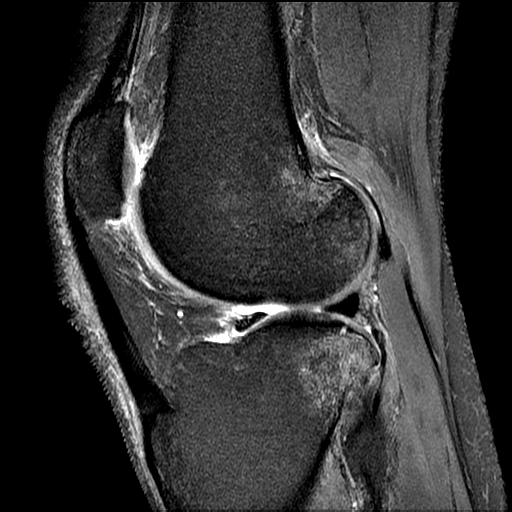
[im 14/24]
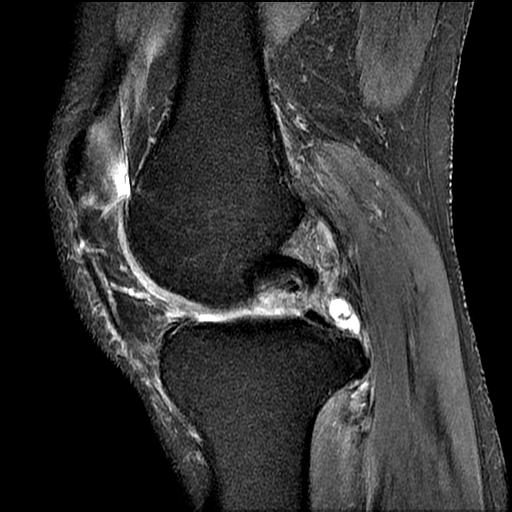
[im 20/24]
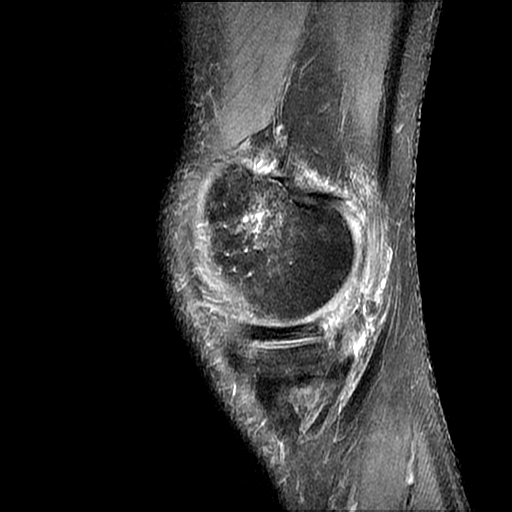

[Series 8: PD fat-sat · oblique · 2.0mm · 0.31mm/px · 3 of 13 slices shown (3 of 4)]
[im 1/13]
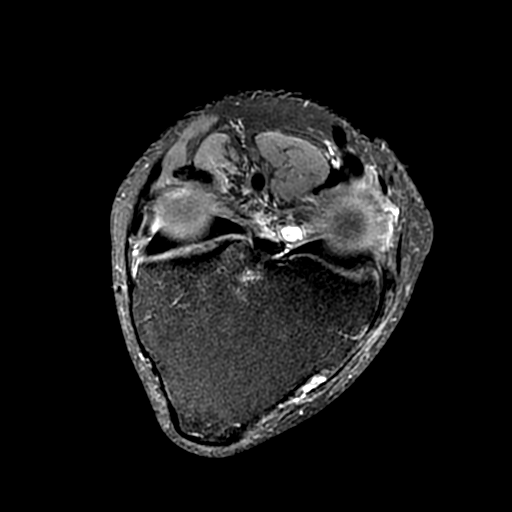
[im 9/13]
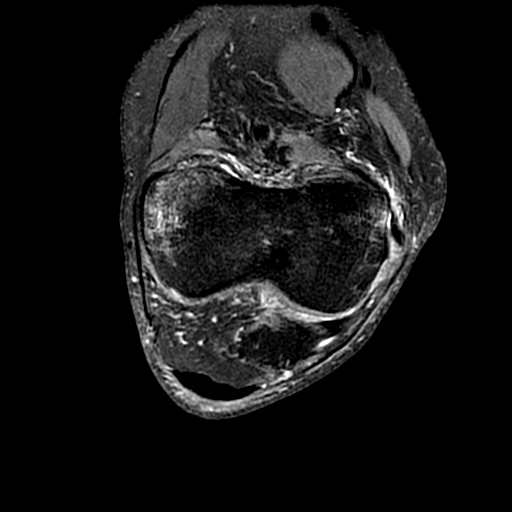
[im 13/13]
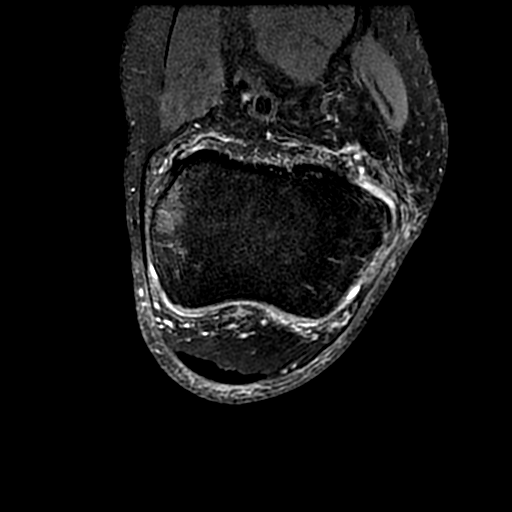

[Series 9: PD fat-sat · coronal · 4.0mm · 0.27mm/px · 3 of 20 slices shown (4 of 4)]
[im 4/20]
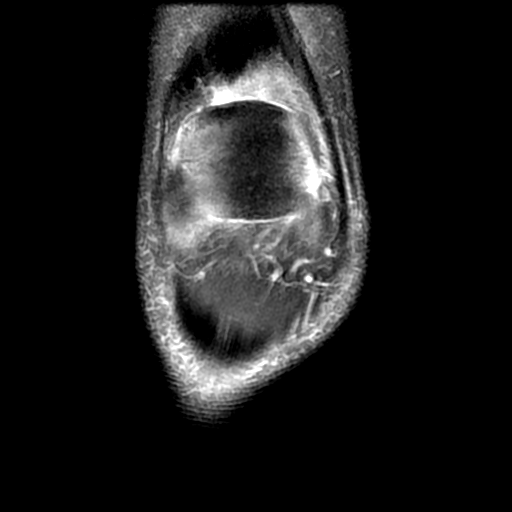
[im 12/20]
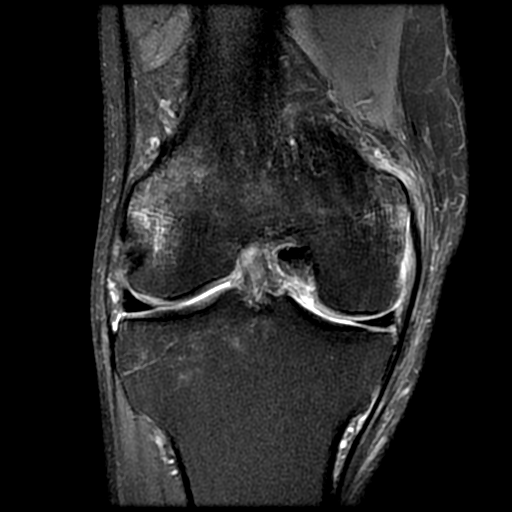
[im 20/20]
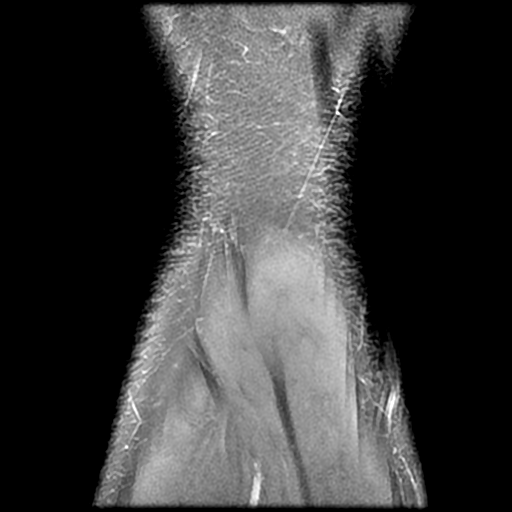

[19 of 40 positions shown; findings below may reference images not displayed]

FINDINGS: MENISCI

Medial meniscus: Mild degenerative signal within the meniscal body.
No meniscal tear or displaced meniscal fragment identified. The
meniscal root is intact.

Lateral meniscus:  Intact with normal morphology.

LIGAMENTS

Cruciates:  Intact.

Collaterals: The medial collateral ligament is thickened and
edematous proximally consistent with a partial tear. There is no
full-thickness tear or ligamentous laxity. The lateral collateral
ligament complex appears unremarkable.

CARTILAGE

Patellofemoral:  Preserved.

Medial:  Minimal chondral thinning without focal defect.

Lateral:  Preserved.

OTHER

Joint:  No significant joint effusion.

Popliteal Fossa: Tiny mildly complex Baker's cyst. There are
additional small loculated ganglia adjacent to the root of the
posterior horn of the medial meniscus.

Extensor Mechanism:  Intact.  Mild prepatellar subcutaneous edema.

Bones: There are lateral compartment bone contusions involving the
posterior aspects of the femoral condyle and tibial plateau. There
is also mild edema within the medial femoral condyle near the MCL
origin. No cortical fracture or osteochondral lesion identified.
IMPRESSION: 1. Grade 2 injury of the medial collateral ligament.
2. Associated lateral compartment bone contusions. Probable mild
reactive edema within the medial femoral condyle near the MCL
origin.
3. No evidence of meniscal or ACL tear.
